# Patient Record
Sex: Male | Born: 1955 | Race: Black or African American | Hispanic: No | Marital: Single | Smoking: Former smoker
Health system: Southern US, Community
[De-identification: ages and names within clinical notes are randomized; demographics above are authoritative.]

## PROBLEM LIST (undated history)

## (undated) DIAGNOSIS — I10 Essential (primary) hypertension: Secondary | ICD-10-CM

## (undated) HISTORY — PX: FRACTURE SURGERY: SHX138

---

## 2014-12-09 ENCOUNTER — Emergency Department (HOSPITAL_BASED_OUTPATIENT_CLINIC_OR_DEPARTMENT_OTHER): Payer: Self-pay

## 2014-12-09 ENCOUNTER — Encounter (HOSPITAL_BASED_OUTPATIENT_CLINIC_OR_DEPARTMENT_OTHER): Payer: Self-pay

## 2014-12-09 ENCOUNTER — Emergency Department (HOSPITAL_BASED_OUTPATIENT_CLINIC_OR_DEPARTMENT_OTHER)
Admission: EM | Admit: 2014-12-09 | Discharge: 2014-12-09 | Disposition: A | Discharge status: Home / Self Care | Payer: Self-pay | Attending: Emergency Medicine | Admitting: Emergency Medicine

## 2014-12-09 DIAGNOSIS — M7122 Synovial cyst of popliteal space [Baker], left knee: Secondary | ICD-10-CM | POA: Insufficient documentation

## 2014-12-09 DIAGNOSIS — Z720 Tobacco use: Secondary | ICD-10-CM | POA: Insufficient documentation

## 2014-12-09 DIAGNOSIS — I10 Essential (primary) hypertension: Secondary | ICD-10-CM | POA: Insufficient documentation

## 2014-12-09 DIAGNOSIS — M660 Rupture of popliteal cyst: Secondary | ICD-10-CM

## 2014-12-09 DIAGNOSIS — M7989 Other specified soft tissue disorders: Secondary | ICD-10-CM

## 2014-12-09 HISTORY — DX: Essential (primary) hypertension: I10

## 2014-12-09 LAB — CBC WITH DIFFERENTIAL/PLATELET
Basophils Absolute: 0.1 10*3/uL (ref 0.0–0.1)
Basophils Relative: 1 % (ref 0–1)
EOS PCT: 9 % — AB (ref 0–5)
Eosinophils Absolute: 0.5 10*3/uL (ref 0.0–0.7)
HCT: 33.2 % — ABNORMAL LOW (ref 39.0–52.0)
Hemoglobin: 10.8 g/dL — ABNORMAL LOW (ref 13.0–17.0)
LYMPHS PCT: 20 % (ref 12–46)
Lymphs Abs: 1.2 10*3/uL (ref 0.7–4.0)
MCH: 28.4 pg (ref 26.0–34.0)
MCHC: 32.5 g/dL (ref 30.0–36.0)
MCV: 87.4 fL (ref 78.0–100.0)
Monocytes Absolute: 0.5 10*3/uL (ref 0.1–1.0)
Monocytes Relative: 9 % (ref 3–12)
NEUTROS PCT: 61 % (ref 43–77)
Neutro Abs: 3.7 10*3/uL (ref 1.7–7.7)
Platelets: 270 10*3/uL (ref 150–400)
RBC: 3.8 MIL/uL — ABNORMAL LOW (ref 4.22–5.81)
RDW: 12.2 % (ref 11.5–15.5)
WBC: 6 10*3/uL (ref 4.0–10.5)

## 2014-12-09 LAB — URINALYSIS, ROUTINE W REFLEX MICROSCOPIC
BILIRUBIN URINE: NEGATIVE
Glucose, UA: NEGATIVE mg/dL
Hgb urine dipstick: NEGATIVE
Ketones, ur: NEGATIVE mg/dL
Leukocytes, UA: NEGATIVE
NITRITE: NEGATIVE
Protein, ur: NEGATIVE mg/dL
SPECIFIC GRAVITY, URINE: 1.014 (ref 1.005–1.030)
Urobilinogen, UA: 0.2 mg/dL (ref 0.0–1.0)
pH: 5.5 (ref 5.0–8.0)

## 2014-12-09 LAB — BASIC METABOLIC PANEL
ANION GAP: 3 — AB (ref 5–15)
BUN: 12 mg/dL (ref 6–23)
CHLORIDE: 107 mmol/L (ref 96–112)
CO2: 27 mmol/L (ref 19–32)
Calcium: 8.9 mg/dL (ref 8.4–10.5)
Creatinine, Ser: 1.15 mg/dL (ref 0.50–1.35)
GFR calc non Af Amer: 68 mL/min — ABNORMAL LOW (ref >90)
GFR, EST AFRICAN AMERICAN: 79 mL/min — AB (ref >90)
Glucose, Bld: 101 mg/dL — ABNORMAL HIGH (ref 70–99)
Potassium: 4 mmol/L (ref 3.5–5.1)
Sodium: 137 mmol/L (ref 135–145)

## 2014-12-09 MED ORDER — IOHEXOL 300 MG/ML  SOLN
100.0000 mL | Freq: Once | INTRAMUSCULAR | Status: AC | PRN
  Administered 2014-12-09: 100 mL via INTRAVENOUS

## 2014-12-09 MED ORDER — OXYCODONE-ACETAMINOPHEN 5-325 MG PO TABS: 1.0000 | ORAL_TABLET | ORAL | Status: DC | PRN

## 2014-12-09 MED ORDER — AMLODIPINE BESYLATE 5 MG PO TABS
10.0000 mg | ORAL_TABLET | Freq: Once | ORAL | Status: AC
  Administered 2014-12-09: 10 mg via ORAL
  Filled 2014-12-09: qty 2

## 2014-12-09 NOTE — ED Notes (Signed)
MD Littie DeedsGentry at bedside discussing results

## 2014-12-09 NOTE — Discharge Instructions (Signed)
Baker Cyst °A Baker cyst is a sac-like structure that forms in the back of the knee. It is filled with the same fluid that is located in your knee. This fluid lubricates the bones and cartilage of the knee and allows them to move over each other more easily. °CAUSES  °When the knee becomes injured or inflamed, increased fluid forms in the knee. When this happens, the joint lining is pushed out behind the knee and forms the Baker cyst. This cyst may also be caused by inflammation from arthritic conditions and infections. °SIGNS AND SYMPTOMS  °A Baker cyst usually has no symptoms. When the cyst is substantially enlarged: °· You may feel pressure behind the knee, stiffness in the knee, or a mass in the area behind the knee. °· You may develop pain, redness, and swelling in the calf.  This can suggest a blood clot and requires evaluation by your health care provider. °DIAGNOSIS  °A Baker cyst is most often found during an ultrasound exam. This exam may have been performed for other reasons, and the cyst was found incidentally. Sometimes an MRI is used. This picks up other problems within a joint that an ultrasound exam may not. If the Baker cyst developed immediately after an injury, X-ray exams may be used to diagnose the cyst. °TREATMENT  °The treatment depends on the cause of the cyst. Anti-inflammatory medicines and rest often will be prescribed. If the cyst is caused by a bacterial infection, antibiotic medicines may be prescribed.  °HOME CARE INSTRUCTIONS  °· If the cyst was caused by an injury, for the first 24 hours, keep the injured leg elevated on 2 pillows while lying down. °· For the first 24 hours while you are awake, apply ice to the injured area: °¨ Put ice in a plastic bag. °¨ Place a towel between your skin and the bag. °¨ Leave the ice on for 20 minutes, 2-3 times a day. °· Only take over-the-counter or prescription medicines for pain, discomfort, or fever as directed by your health care  provider. °· Only take antibiotic medicine as directed. Make sure to finish it even if you start to feel better. °MAKE SURE YOU:  °· Understand these instructions. °· Will watch your condition. °· Will get help right away if you are not doing well or get worse. °Document Released: 10/28/2005 Document Revised: 08/18/2013 Document Reviewed: 06/09/2013 °[employer]® Patient Information ©2015 [employer]. This information is not intended to replace advice given to you by your health care provider. Make sure you discuss any questions you have with your health care provider. ° °

## 2014-12-09 NOTE — ED Notes (Signed)
Pt reports left leg swelling and pain. [facility] Given abx and pain medication. Pt had doppler of leg, which was negative.

## 2014-12-09 NOTE — ED Provider Notes (Signed)
CSN: [id number]     Arrival date & time 12/09/14  4098 History   First MD Initiated Contact with Patient 12/09/14 0900     Chief Complaint  Patient presents with  . Leg Swelling     (Consider location/radiation/quality/duration/timing/severity/associated sxs/prior Treatment) Patient is a 59 y.o. male presenting with leg pain.  Leg Pain Lower extremity pain location: L leg distal to knee. Time since incident:  1 month Injury: no   Pain details:    Quality:  Aching   Radiates to:  Does not radiate   Severity:  Moderate   Onset quality:  Gradual   Duration:  1 month   Timing:  Constant   Progression:  Worsening Chronicity:  New Prior injury to area:  No Relieved by:  Elevation Worsened by:  Bearing weight Associated symptoms: stiffness and swelling   Associated symptoms: no back pain, no decreased ROM, no fever, no itching, no muscle weakness, no neck pain and no numbness     Past Medical History  Diagnosis Date  . Hypertension    Past Surgical History  Procedure Laterality Date  . Fracture surgery     No family history on file. History  Substance Use Topics  . Smoking status: Current Some Day Smoker  . Smokeless tobacco: Not on file  . Alcohol Use: Yes    Review of Systems  Constitutional: Negative for fever.  Musculoskeletal: Positive for stiffness. Negative for back pain and neck pain.  Skin: Negative for itching.  All other systems reviewed and are negative.     Allergies  Review of patient's allergies indicates no known allergies.  Home Medications   Prior to Admission medications   Medication Sig Start Date End Date Taking? Authorizing Provider  oxyCODONE-acetaminophen (PERCOCET/ROXICET) 5-325 MG per tablet Take 1-2 tablets by mouth every 4 (four) hours as needed for severe pain. 12/09/14   Mirian Mo, MD   BP 192/112 mmHg  Pulse 90  Temp(Src) 98 F (36.7 C) (Oral)  Resp 18  Ht  (1.702 m)  Wt 180 lb (81.647 kg)  BMI 28.19 kg/m2  SpO2  98% Physical Exam  Constitutional: He is oriented to person, place, and time. He appears well-developed and well-nourished.  HENT:  Head: Normocephalic and atraumatic.  Eyes: Conjunctivae and EOM are normal.  Neck: Normal range of motion. Neck supple.  Cardiovascular: Normal rate, regular rhythm and normal heart sounds.   Pulmonary/Chest: Effort normal and breath sounds normal. No respiratory distress.  Abdominal: He exhibits no distension. There is no tenderness. There is no rebound and no guarding.  Musculoskeletal: Normal range of motion.       Left lower leg: He exhibits tenderness, swelling and edema. He exhibits no bony tenderness, no deformity and no laceration.  Neurological: He is alert and oriented to person, place, and time.  Skin: Skin is warm and dry.  Vitals reviewed.   ED Course  Procedures (including critical care time) Labs Review Labs Reviewed  CBC WITH DIFFERENTIAL/PLATELET - Abnormal; Notable for the following:    RBC 3.80 (*)    Hemoglobin 10.8 (*)    HCT 33.2 (*)    Eosinophils Relative 9 (*)    All other components within normal limits  BASIC METABOLIC PANEL - Abnormal; Notable for the following:    Glucose, Bld 101 (*)    GFR calc non Af Amer 68 (*)    GFR calc Af Amer 79 (*)    Anion gap 3 (*)    All other  components within normal limits  URINALYSIS, ROUTINE W REFLEX MICROSCOPIC    Imaging Review No results found.   EKG Interpretation None     CT Tibia Fibula Left W Contrast (Final result) Result time: 12/09/14 12:51:10   Final result by Rad Results In Interface (12/09/14 12:51:10)   Narrative:   CLINICAL DATA: Left lower leg swelling and pain since 12/04/2014.  EXAM: CT OF THE LEFT TIBIA AND FIBULA WITH CONTRAST  TECHNIQUE: Contiguous axial CT images of the left lower leg were obtained after IV contrast administration. Coronal and sagittal reformatted images are provided.  CONTRAST: 100 mL OMNIPAQUE IOHEXOL 300 MG/ML  SOLN  COMPARISON: Left lower extremity Doppler ultrasound earlier this same day.  FINDINGS: Extensive subcutaneous edema is present about the left lower leg. The patient has a large Baker's cyst dissecting along the medial gastrocnemius deep to the superficial fascia. The cyst measures approximately 17.5 cm craniocaudal by up to 7.8 cm transverse by 4.3 cm AP. The largest component of the cyst is centered approximately 9.5 cm below the joint line. High attenuation material within the cyst is most consistent with hemorrhage given lack of flow on Doppler imaging on the comparison ultrasound. The cyst causes mass effect on the gastrocnemius.  There is a small knee joint effusion. All imaged osseous structures appear normal. No evidence of muscle or tendon tear is identified. Major caliber vascular structures enhance normally. As visualized by CT scan, intrinsic structures of the knee appear normal.  IMPRESSION: Findings most consistent with a large hemorrhagic Baker's cyst dissecting along the medial gastrocnemius resulting in mass effect on the muscle belly. Repeat ultrasound in 6-8 weeks to ensure resolution is recommended.  Diffuse subcutaneous edema about the left lower leg.  Small knee joint effusion present small left knee joint effusion.   Electronically Signed By: Drusilla Kannerhomas Dalessio M.D. On: 12/09/2014 12:51          US Venous Img Lower Unilateral Left (Final result) Result time: 12/09/14 10:28:41   Procedure changed from US Extrem Low Left Comp      Final result by Rad Results In Interface (12/09/14 10:28:41)   Narrative:   CLINICAL DATA: Left lower leg swelling for 1 month, additional encounter  EXAM: Left LOWER EXTREMITY VENOUS DOPPLER ULTRASOUND  TECHNIQUE: Gray-scale sonography with graded compression, as well as color Doppler and duplex ultrasound were performed to evaluate the lower extremity deep venous systems from the level of the common  femoral vein and including the common femoral, femoral, profunda femoral, popliteal and calf veins including the posterior tibial, peroneal and gastrocnemius veins when visible. The superficial great saphenous vein was also interrogated. Spectral Doppler was utilized to evaluate flow at rest and with distal augmentation maneuvers in the common femoral, femoral and popliteal veins.  COMPARISON: By report from a [facility] exam [facility] dated 12/04/2014. The images are not available for comparison.  FINDINGS: Contralateral Common Femoral Vein: Respiratory phasicity is normal and symmetric with the symptomatic side. No evidence of thrombus. Normal compressibility.  Common Femoral Vein: No evidence of thrombus. Normal compressibility, respiratory phasicity and response to augmentation.  Saphenofemoral Junction: No evidence of thrombus. Normal compressibility and flow on color Doppler imaging.  Profunda Femoral Vein: No evidence of thrombus. Normal compressibility and flow on color Doppler imaging.  Femoral Vein: No evidence of thrombus. Normal compressibility, respiratory phasicity and response to augmentation.  Popliteal Vein: No evidence of thrombus. Normal compressibility, respiratory phasicity and response to augmentation.  Calf Veins: No evidence of  thrombus. Normal compressibility and flow on color Doppler imaging.  Superficial Great Saphenous Vein: No evidence of thrombus. Normal compressibility and flow on color Doppler imaging.  Venous Reflux: None.  Other Findings: Extending from the popliteal fossa into the medial aspect of the proximal calf there is a somewhat complex collection identified which measures 18 cm 18 x 8 x 3 cm in greatest dimensions. Correlation with any proximal previous injury is recommended as this may represent a large hematoma. Alternatively a could represent a ruptured complex Baker cyst. Calf edema is  noted.  IMPRESSION: No evidence of deep venous thrombosis.  Changes in the soft tissues of the medial left calf which may represent a focal hematoma or complicated Baker's cyst. These changes were not described on the prior exam. MRI may be helpful in further characterization on a nonemergent basis.   Electronically Signed By: Alcide Clever M.D. On: 12/09/2014 10:16       MDM   Final diagnoses:  Baker's cyst, ruptured    59 y.o. male with pertinent PMH of HTN presents with l leg swelling x 1 month.  Patient was seen at [facility], diagnosed with cellulitis and sent home with clindamycin.  He reportedly had a negative ultrasound.  On arrival today vitals and physical exam as above. No recent risk factors for DVT, however patient has 3+ pitting edema to the left lower extremity, not present on the right. Leg not warm, nonerythematous. Patient denies systemic symptoms.    Dorna Bloom revealed hemorrhagic bakers cyst.  Pulses intact distally, sensation intact, mild pain.  Doubt compartment syndrome.  Spoke with ortho who recommended outpt fu.  DC home in stable condition  I have reviewed all laboratory and imaging studies if ordered as above  1. Baker's cyst, ruptured   2. Leg swelling         Mirian Mo, MD 12/13/14 0430

## 2014-12-09 NOTE — ED Notes (Signed)
Patient transported to Ultrasound 

## 2014-12-09 NOTE — ED Notes (Signed)
MD at bedside. 

## 2014-12-09 NOTE — ED Notes (Signed)
Pt is supposed to be on HCTZ and Norvasc. Pt sts he is out of medication.

## 2014-12-16 ENCOUNTER — Ambulatory Visit (INDEPENDENT_AMBULATORY_CARE_PROVIDER_SITE_OTHER): Payer: No Typology Code available for payment source | Admitting: Family Medicine

## 2014-12-16 ENCOUNTER — Encounter (HOSPITAL_BASED_OUTPATIENT_CLINIC_OR_DEPARTMENT_OTHER): Payer: Self-pay | Admitting: Emergency Medicine

## 2014-12-16 ENCOUNTER — Encounter: Payer: Self-pay | Admitting: Family Medicine

## 2014-12-16 ENCOUNTER — Emergency Department (HOSPITAL_BASED_OUTPATIENT_CLINIC_OR_DEPARTMENT_OTHER)
Admission: EM | Admit: 2014-12-16 | Discharge: 2014-12-16 | Disposition: A | Discharge status: Home / Self Care | Payer: Self-pay | Attending: Emergency Medicine | Admitting: Emergency Medicine

## 2014-12-16 VITALS — BP 199/127 | HR 74 | Ht 67.0 in | Wt 180.0 lb

## 2014-12-16 DIAGNOSIS — Z792 Long term (current) use of antibiotics: Secondary | ICD-10-CM | POA: Insufficient documentation

## 2014-12-16 DIAGNOSIS — I10 Essential (primary) hypertension: Secondary | ICD-10-CM | POA: Insufficient documentation

## 2014-12-16 DIAGNOSIS — M25562 Pain in left knee: Secondary | ICD-10-CM

## 2014-12-16 DIAGNOSIS — Z87891 Personal history of nicotine dependence: Secondary | ICD-10-CM | POA: Insufficient documentation

## 2014-12-16 DIAGNOSIS — M660 Rupture of popliteal cyst: Secondary | ICD-10-CM

## 2014-12-16 DIAGNOSIS — Z79899 Other long term (current) drug therapy: Secondary | ICD-10-CM | POA: Insufficient documentation

## 2014-12-16 DIAGNOSIS — M7122 Synovial cyst of popliteal space [Baker], left knee: Secondary | ICD-10-CM | POA: Insufficient documentation

## 2014-12-16 MED ORDER — HYDROCODONE-ACETAMINOPHEN 5-325 MG PO TABS
2.0000 | ORAL_TABLET | Freq: Once | ORAL | Status: AC
  Administered 2014-12-16: 2 via ORAL
  Filled 2014-12-16: qty 2

## 2014-12-16 MED ORDER — HYDROCODONE-ACETAMINOPHEN 5-325 MG PO TABS: 1.0000 | ORAL_TABLET | Freq: Four times a day (QID) | ORAL | Status: DC | PRN

## 2014-12-16 MED ORDER — METHYLPREDNISOLONE ACETATE 40 MG/ML IJ SUSP
40.0000 mg | Freq: Once | INTRAMUSCULAR | Status: AC
  Administered 2014-12-16: 40 mg via INTRA_ARTICULAR

## 2014-12-16 NOTE — Progress Notes (Signed)
  CARE MANAGEMENT ED NOTE 12/16/2014  Patient:  [name] Chaney Chaney,[name] Chaney   Account Number:  [id number]  Date Initiated:  12/16/2014  Documentation initiated by:  Edd ArbourGIBBS,Jermani Eberlein  Subjective/Objective Assessment:   59 yr old self [place] ([place] listed as address in EPIC- confirmed by pt)  left leg pain behind knee cap.  Reports swelling and that he was seen here last week for the same and was diagnosed with Baker's cyst.  Pt     Subjective/Objective Assessment Detail:   reports he has been taking clindamycin as prescribed, and ibprofen and oxycodone for pain.     Pcp Verdell FaceSheri Lim as confimred by pt at 1227 12/16/14    Carola FrostLim Sheri Davis DO Internist  Address: [address], [place], [place] [address]  [phone]    Pt confirmed [facility] prior to leaving     Action/Plan:   CM received consult from ED SW, Kristen who was called Pt needed pcp assistance Spoek with pt who confirms he has a pcp 1235 ED CM updated Dr DM [facility] Cm signing off   Action/Plan Detail:   pt was called mobile # [phone]   Anticipated DC Date:  12/16/2014     Status Recommendation to Physician:   Result of Recommendation:    Other ED Services  Consult Working Plan    DC Planning Services  Other  Outpatient Services - Pt will follow up  PCP issues    Choice offered to / List presented to:            Status of service:  Completed, signed off  ED Comments:   ED Comments Detail:

## 2014-12-16 NOTE — Progress Notes (Signed)
CSW received consult for follow up and PCP. CSW informed ED RN CM who will follow up.   Byrd HesselbachKristen Hilton Saephan, LCSW [phone]  ED CSW 12/16/2014 11:30 AM

## 2014-12-16 NOTE — ED Provider Notes (Signed)
CSN: [id number]     Arrival date & time 12/16/14  0817 History   First MD Initiated Contact with Patient 12/16/14 [phone]     Chief Complaint  Patient presents with  . Leg Pain    Pt reports left leg pain behind knee cap.  Reports swelling and that he was seen here last week for the same and was diagnosed with Baker's cyst.  Pt reports he has been taking clindamycin as prescribed, and ibprofen and oxycodone for pain.     (Consider location/radiation/quality/duration/timing/severity/associated sxs/prior Treatment) Patient is a 59 y.o. male presenting with leg pain. The history is provided by the patient. No language interpreter was used.  Leg Pain Location:  Leg Time since incident: 2.5 months] Injury: no   Leg location:  L lower leg Pain details:    Quality:  Aching and cramping   Radiates to:  L leg   Severity:  Moderate   Onset quality:  Sudden   Duration: 2.5 months.   Timing:  Constant   Progression:  Waxing and waning Dislocation: no   Foreign body present:  No foreign bodies Tetanus status:  Up to date Relieved by:  Rest Worsened by:  Bearing weight and activity Ineffective treatments:  NSAIDs and rest Associated symptoms: swelling   Associated symptoms: no back pain, no decreased ROM, no fatigue, no fever, no itching, no neck pain, no numbness, no stiffness and no tingling   Risk factors: no concern for non-accidental trauma, no frequent fractures, no known bone disorder, no obesity and no recent illness     Past Medical History  Diagnosis Date  . Hypertension    Past Surgical History  Procedure Laterality Date  . Fracture surgery     History reviewed. No pertinent family history. History  Substance Use Topics  . Smoking status: Former Games developer  . Smokeless tobacco: Not on file  . Alcohol Use: Yes    Review of Systems  Constitutional: Negative for fever, activity change, appetite change and fatigue.  HENT: Negative for congestion, facial swelling, rhinorrhea and  trouble swallowing.   Eyes: Negative for photophobia and pain.  Respiratory: Negative for cough, chest tightness and shortness of breath.   Cardiovascular: Negative for chest pain and leg swelling.  Gastrointestinal: Negative for nausea, vomiting, abdominal pain, diarrhea and constipation.  Endocrine: Negative for polydipsia and polyuria.  Genitourinary: Negative for dysuria, urgency, decreased urine volume and difficulty urinating.  Musculoskeletal: Negative for back pain, gait problem, stiffness and neck pain.  Skin: Negative for color change, itching, rash and wound.  Allergic/Immunologic: Negative for immunocompromised state.  Neurological: Negative for dizziness, facial asymmetry, speech difficulty, weakness, numbness and headaches.  Psychiatric/Behavioral: Negative for confusion, decreased concentration and agitation.      Allergies  Review of patient's allergies indicates no known allergies.  Home Medications   Prior to Admission medications   Medication Sig Start Date End Date Taking? Authorizing Provider  amLODipine (NORVASC) 10 MG tablet Take 10 mg by mouth daily.   Yes Historical Provider, MD  clindamycin (CLEOCIN) 150 MG capsule Take 300 mg by mouth 4 (four) times daily.   Yes Historical Provider, MD  cyclobenzaprine (FLEXERIL) 5 MG tablet Take 5 mg by mouth at bedtime.   Yes Historical Provider, MD  HYDROcodone-acetaminophen (NORCO/VICODIN) 5-325 MG per tablet Take 1 tablet by mouth every 6 (six) hours as needed for moderate pain.   Yes Historical Provider, MD  ibuprofen (ADVIL,MOTRIN) 800 MG tablet Take 800 mg by mouth every 8 (eight) hours as  needed.   Yes Historical Provider, MD  lisinopril (PRINIVIL,ZESTRIL) 20 MG tablet Take 20 mg by mouth daily.   Yes Historical Provider, MD  oxyCODONE-acetaminophen (PERCOCET/ROXICET) 5-325 MG per tablet Take 1-2 tablets by mouth every 4 (four) hours as needed for severe pain. 12/09/14   Mirian MoMatthew Gentry, MD   BP 186/104 mmHg  Pulse 92   Temp(Src) 97.7 F (36.5 C) (Oral)  Resp 18  Ht 5\' 7"  (1.702 m)  Wt 180 lb (81.647 kg)  BMI 28.19 kg/m2  SpO2 100% Physical Exam  Constitutional: He is oriented to person, place, and time. He appears well-developed and well-nourished. No distress.  HENT:  Head: Normocephalic and atraumatic.  Mouth/Throat: No oropharyngeal exudate.  Eyes: Pupils are equal, round, and reactive to light.  Neck: Normal range of motion. Neck supple.  Cardiovascular: Normal rate, regular rhythm and normal heart sounds.  Exam reveals no gallop and no friction rub.   No murmur heard. Pulmonary/Chest: Effort normal and breath sounds normal. No respiratory distress. He has no wheezes. He has no rales.  Abdominal: Soft. Bowel sounds are normal. He exhibits no distension and no mass. There is no tenderness. There is no rebound and no guarding.  Musculoskeletal: Normal range of motion. He exhibits no edema or tenderness.   Pitting edema of left lower leg from knee to ankle with evidence of old appearing ecchymosis around the ankle and foot.  Gastrocnemius is firm and mildly tender.  He has worse tenderness at popliteal fossa.  He has no decreased range of motion of the knee or ankle.  He has normal sensation and strength distally.  Pulses are 2+ distally.  Neurological: He is alert and oriented to person, place, and time.  Skin: Skin is warm and dry.  Psychiatric: He has a normal mood and affect.    ED Course  Procedures (including critical care time) Labs Review Labs Reviewed - No data to display  Imaging Review No results found.   EKG Interpretation None     CLINICAL DATA: Left lower leg swelling and pain since 12/04/2014.  EXAM: CT OF THE LEFT TIBIA AND FIBULA WITH CONTRAST  TECHNIQUE: Contiguous axial CT images of the left lower leg were obtained after IV contrast administration. Coronal and sagittal reformatted images are provided.  CONTRAST: 100 mL OMNIPAQUE IOHEXOL 300 MG/ML  SOLN  COMPARISON: Left lower extremity Doppler ultrasound earlier this same day.  FINDINGS: Extensive subcutaneous edema is present about the left lower leg. The patient has a large Baker's cyst dissecting along the medial gastrocnemius deep to the superficial fascia. The cyst measures approximately 17.5 cm craniocaudal by up to 7.8 cm transverse by 4.3 cm AP. The largest component of the cyst is centered approximately 9.5 cm below the joint line. High attenuation material within the cyst is most consistent with hemorrhage given lack of flow on Doppler imaging on the comparison ultrasound. The cyst causes mass effect on the gastrocnemius.  There is a small knee joint effusion. All imaged osseous structures appear normal. No evidence of muscle or tendon tear is identified. Major caliber vascular structures enhance normally. As visualized by CT scan, intrinsic structures of the knee appear normal.  IMPRESSION: Findings most consistent with a large hemorrhagic Baker's cyst dissecting along the medial gastrocnemius resulting in mass effect on the muscle belly. Repeat ultrasound in 6-8 weeks to ensure resolution is recommended.  Diffuse subcutaneous edema about the left lower leg.  Small knee joint effusion present small left knee joint effusion.   Electronically  Signed  By: Drusilla Kanner M.D.  On: 12/09/2014 12:51  MDM   Final diagnoses:  Ruptured Bakers cyst    Pt is a 59 y.o. male with Pmhx as above who presents with 2 months of left leg pain and swelling from posterior knee down to ankle.  He said no fevers or chills.  No numbness or tingling or weakness but does have pain with ambulation.  Pain is worse in popliteal fossa.  Calf is firm, and he has pitting edema to the level of knee, with evidence of healing ecchymosis around foot.  Leg is most tender in popliteal fossa.  He was seen one week ago for same, had a DVT study, which showed no evidence of DVT, but  possibly complicated Baker's cyst rupture.  He then had a CT leg with contrast, which showed, evidence of a large hemorrhagic Baker cyst dissecting along the medial gastrocnemius resolving and mass effect on the muscle belly.  Repeat ultrasound in 6-8 weeks was recommended.  Patient was given outpatient follow-up information for orthopedics.  He reports calling but that he cannot get an appointment as he does not have insurance.  He states his other but otherwise been well except for his blood pressure being high.  He has not had chest pain or shortness of breath.  This Is firm, although he has no other signs or symptoms of compartment syndrome, which I doubt.  I spoke w/ Dr. Pearletha Forge with sports medicine on the phone who has graciously agreed to see him upstairs in the office now.       Toy Cookey, MD 12/16/14 1106

## 2014-12-16 NOTE — Discharge Instructions (Signed)
Baker Cyst °A Baker cyst is a sac-like structure that forms in the back of the knee. It is filled with the same fluid that is located in your knee. This fluid lubricates the bones and cartilage of the knee and allows them to move over each other more easily. °CAUSES  °When the knee becomes injured or inflamed, increased fluid forms in the knee. When this happens, the joint lining is pushed out behind the knee and forms the Baker cyst. This cyst may also be caused by inflammation from arthritic conditions and infections. °SIGNS AND SYMPTOMS  °A Baker cyst usually has no symptoms. When the cyst is substantially enlarged: °· You may feel pressure behind the knee, stiffness in the knee, or a mass in the area behind the knee. °· You may develop pain, redness, and swelling in the calf.  This can suggest a blood clot and requires evaluation by your health care provider. °DIAGNOSIS  °A Baker cyst is most often found during an ultrasound exam. This exam may have been performed for other reasons, and the cyst was found incidentally. Sometimes an MRI is used. This picks up other problems within a joint that an ultrasound exam may not. If the Baker cyst developed immediately after an injury, X-ray exams may be used to diagnose the cyst. °TREATMENT  °The treatment depends on the cause of the cyst. Anti-inflammatory medicines and rest often will be prescribed. If the cyst is caused by a bacterial infection, antibiotic medicines may be prescribed.  °HOME CARE INSTRUCTIONS  °· If the cyst was caused by an injury, for the first 24 hours, keep the injured leg elevated on 2 pillows while lying down. °· For the first 24 hours while you are awake, apply ice to the injured area: °¨ Put ice in a plastic bag. °¨ Place a towel between your skin and the bag. °¨ Leave the ice on for 20 minutes, 2-3 times a day. °· Only take over-the-counter or prescription medicines for pain, discomfort, or fever as directed by your health care  provider. °· Only take antibiotic medicine as directed. Make sure to finish it even if you start to feel better. °MAKE SURE YOU:  °· Understand these instructions. °· Will watch your condition. °· Will get help right away if you are not doing well or get worse. °Document Released: 10/28/2005 Document Revised: 08/18/2013 Document Reviewed: 06/09/2013 °[employer]® Patient Information ©2015 [employer]. This information is not intended to replace advice given to you by your health care provider. Make sure you discuss any questions you have with your health care provider. ° °

## 2014-12-16 NOTE — Patient Instructions (Signed)
You have a large hemorrhagic bakers cyst. We injected your knee with cortisone today - this will help shrink this down. Wear compression sleeve or ACE wrap regularly even at bedtime if you can tolerate this. Icing 15 minutes at a time 3-4 times a day. Ibuprofen 600mg  three times a day with food OR aleve 2 tabs twice a day with food for pain and inflammation. Do some simple ankle motion exercises every day to make sure you don't get a blood clot. If you're not improving over 1-2 weeks call me and we can try to inject some of the cyst directly.

## 2014-12-20 DIAGNOSIS — M25562 Pain in left knee: Secondary | ICD-10-CM | POA: Insufficient documentation

## 2014-12-20 NOTE — Progress Notes (Signed)
PCP: LIM,SHERI, DO  Subjective:   HPI: Patient is a 59 y.o. male here for left leg pain.  Patient reports for past 2 months he's had increased swelling, pain posterior left knee and calf. Had doppler u/s negative for DVT. 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 Pain 9/10 level. Taking norco. No real anterior knee pain. Can feel unstable at times. No catching or locking.  Past Medical History  Diagnosis Date  . Hypertension     Current Outpatient Prescriptions on File Prior to Visit  Medication Sig Dispense Refill  . amLODipine (NORVASC) 10 MG tablet Take 10 mg by mouth daily.    . clindamycin (CLEOCIN) 150 MG capsule Take 300 mg by mouth 4 (four) times daily.    . cyclobenzaprine (FLEXERIL) 5 MG tablet Take 5 mg by mouth at bedtime.    Marland Kitchen. ibuprofen (ADVIL,MOTRIN) 800 MG tablet Take 800 mg by mouth every 8 (eight) hours as needed.    Marland Kitchen. lisinopril (PRINIVIL,ZESTRIL) 20 MG tablet Take 20 mg by mouth daily.    Marland Kitchen. oxyCODONE-acetaminophen (PERCOCET/ROXICET) 5-325 MG per tablet Take 1-2 tablets by mouth every 4 (four) hours as needed for severe pain. 15 tablet 0   No current facility-administered medications on file prior to visit.    Past Surgical History  Procedure Laterality Date  . Fracture surgery      No Known Allergies  History   Social History  . Marital Status: Single    Spouse Name: N/A    Number of Children: N/A  . Years of Education: N/A   Occupational History  . Not on file.   Social History Main Topics  . Smoking status: Former Games developermoker  . Smokeless tobacco: Not on file  . Alcohol Use: 0.0 oz/week    0 Not specified per week  . Drug Use: No  . Sexual Activity: Not on file   Other Topics Concern  . Not on file   Social History Narrative    No family history on file.  BP 199/127 mmHg  Pulse 74  Ht 5\' 7"  (1.702 m)  Wt 180 lb (81.647 kg)  BMI 28.19 kg/m2  Review of Systems: See HPI above.    Objective:  Physical Exam:  Gen: NAD  Left  knee/calf: Swelling proximal to mid calf and posterior knee.  No bruising, erythema, palpable cords. TTP throughout calf and posterior knee.  No anterior tenderness. FROM. Negative ant/post drawers. Negative valgus/varus testing. Negative lachmanns. Negative mcmurrays, apleys, patellar apprehension. NV intact distally.    MSK u/s:  Unfortunately there does not appear to be many hypoechoic areas within his hemorrhagic bakers cyst - could identify three relatively small areas proximal calf.  Assessment & Plan:  1. Left knee pain - 2/2 hemorrhagic bakers cyst.  Unfortunately not many areas I would be able to aspirate to provide him with relief.  Advised going ahead with intraarticular injection - has a small effusion - as medication will travel into the cystic component and less risky.  Compression sleeve, elevation, icing very important.  NSAIDs.  Motion exercises of ankle.  Call in 1-2 weeks if not improving - could consider aspiration/injection of one of these cystic areas.  After informed written consent, patient was lying supine on exam table. Left knee was prepped with alcohol swab and utilizing superolateral approach, patient's left knee was injected intraarticularly with 3:1 marcaine: depomedrol. Patient tolerated the procedure well without immediate complications.

## 2014-12-20 NOTE — Assessment & Plan Note (Signed)
2/2 hemorrhagic bakers cyst.  Unfortunately not many areas I would be able to aspirate to provide him with relief.  Advised going ahead with intraarticular injection - has a small effusion - as medication will travel into the cystic component and less risky.  Compression sleeve, elevation, icing very important.  NSAIDs.  Motion exercises of ankle.  Call in 1-2 weeks if not improving - could consider aspiration/injection of one of these cystic areas.  After informed written consent, patient was lying supine on exam table. Left knee was prepped with alcohol swab and utilizing superolateral approach, patient's left knee was injected intraarticularly with 3:1 marcaine: depomedrol. Patient tolerated the procedure well without immediate complications.

## 2015-06-25 IMAGING — US US EXTREM LOW VENOUS*L*
1 series · 13 of 24 positions shown · non-contrast
Comparison: By report from a prior exam from [REDACTED] dated 12/04/2014. The images are not available for
comparison.

CLINICAL DATA: Left lower leg swelling for 1 month, additional
encounter



[Series 1: us extrem low venous*left* · 0.06mm/px · 13 of 45 slices shown]
[im 1/45]
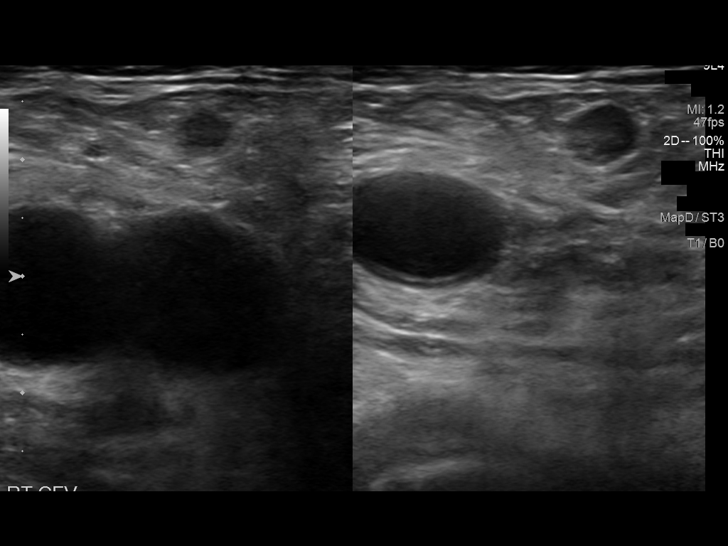
[im 4/45]
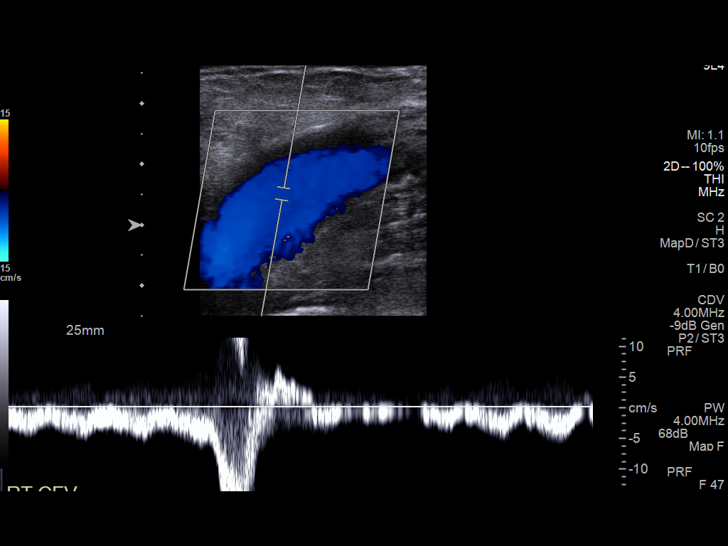
[im 8/45]
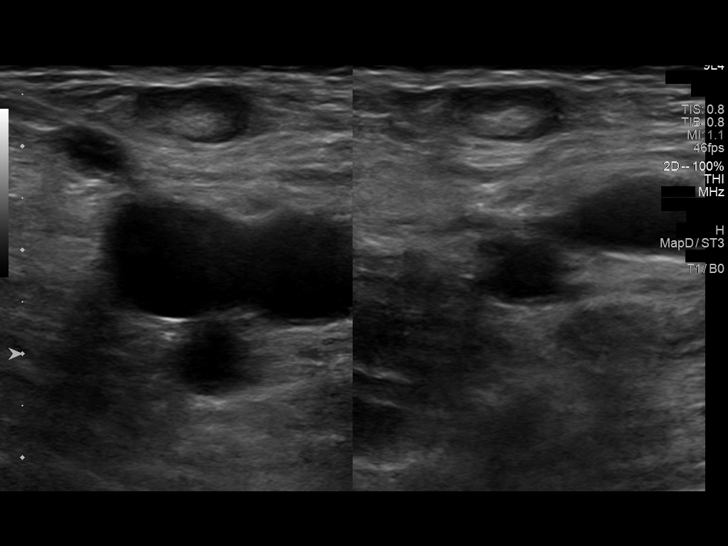
[im 12/45]
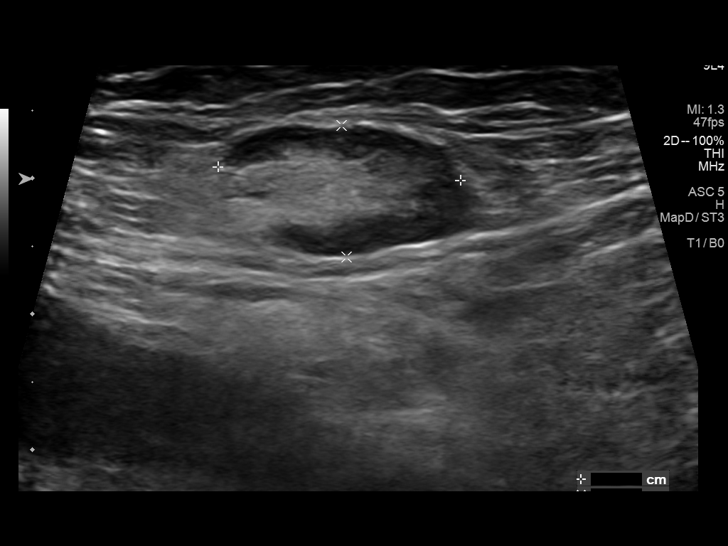
[im 16/45]
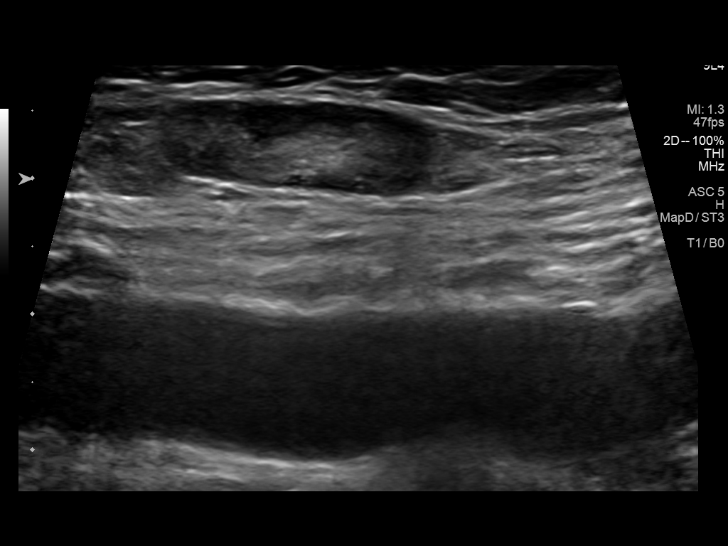
[im 20/45]
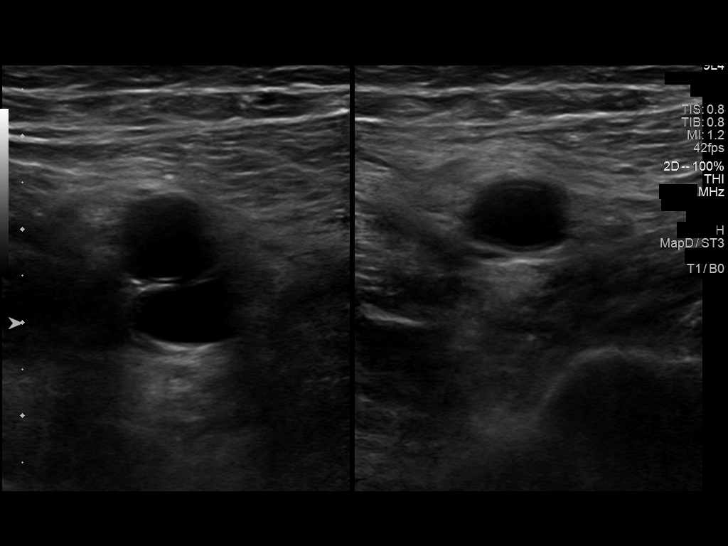
[im 23/45]
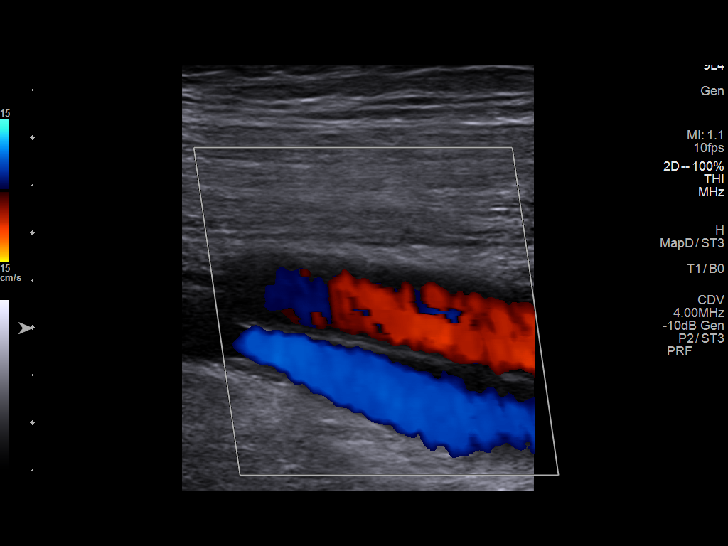
[im 25/45]
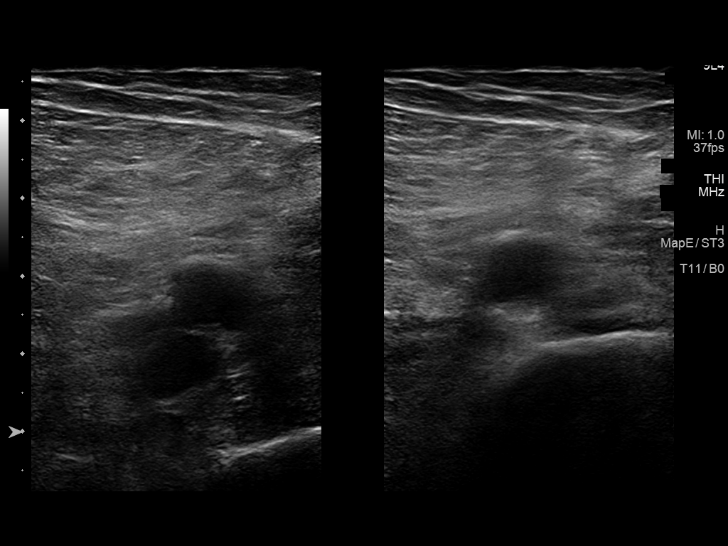
[im 29/45]
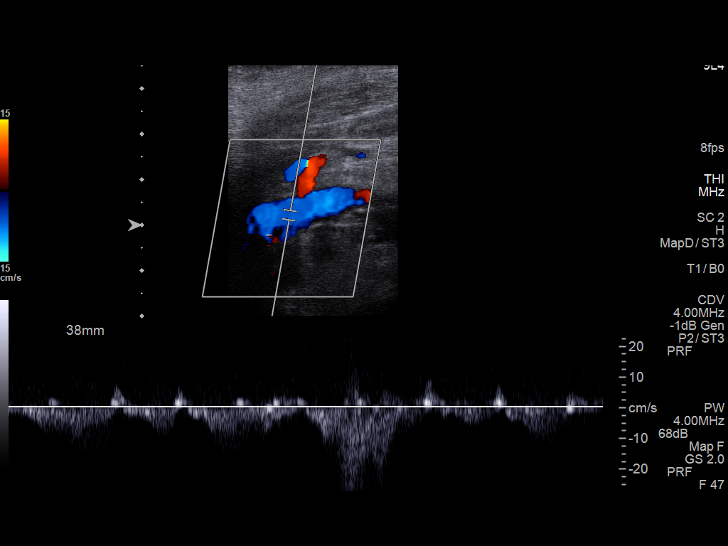
[im 33/45]
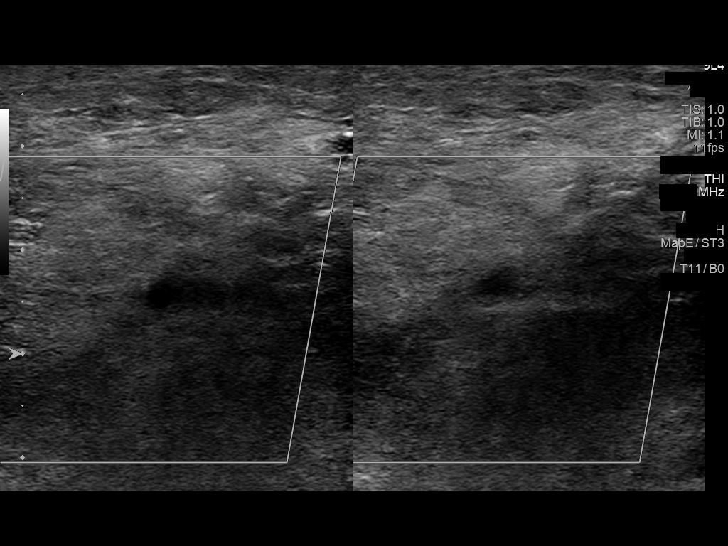
[im 37/45]
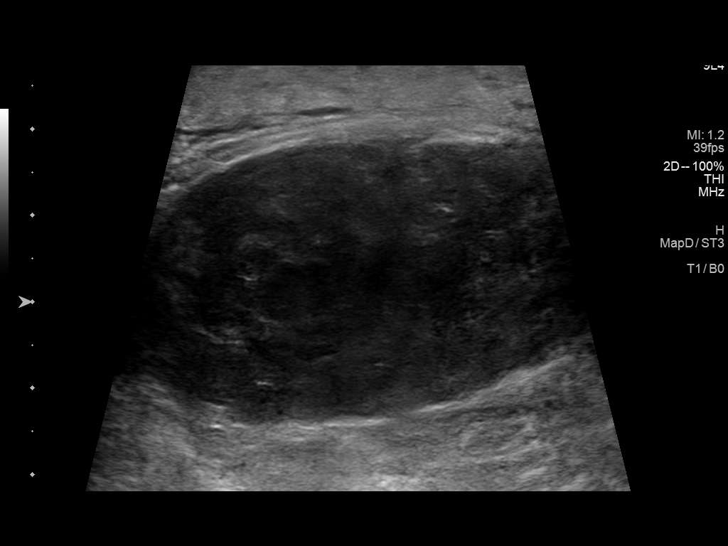
[im 41/45]
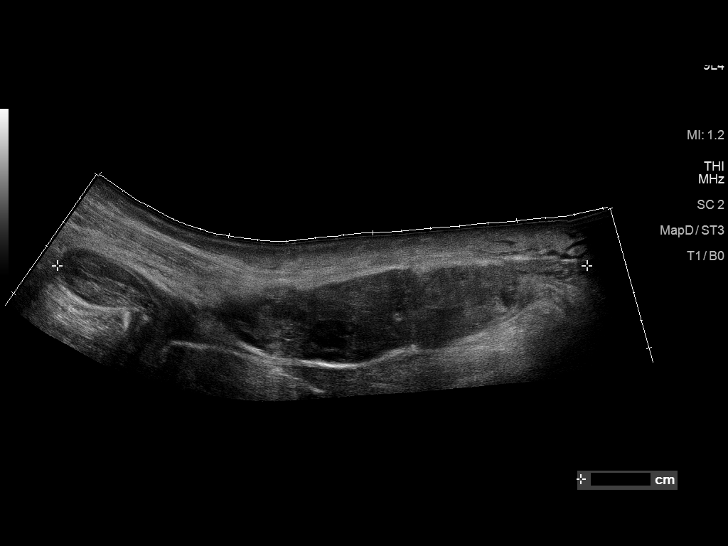
[im 45/45]
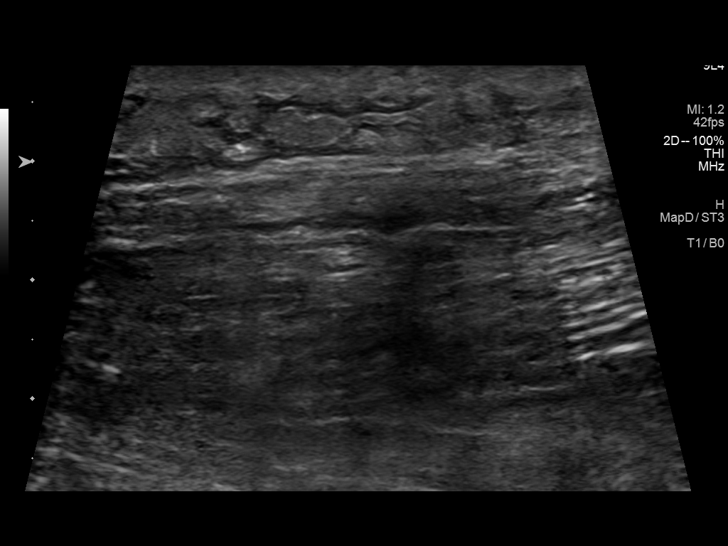

[13 of 24 positions shown; findings below may reference images not displayed]

FINDINGS: Contralateral Common Femoral Vein: Respiratory phasicity is normal
and symmetric with the symptomatic side. No evidence of thrombus.
Normal compressibility.

Common Femoral Vein: No evidence of thrombus. Normal
compressibility, respiratory phasicity and response to augmentation.

Saphenofemoral Junction: No evidence of thrombus. Normal
compressibility and flow on color Doppler imaging.

Profunda Femoral Vein: No evidence of thrombus. Normal
compressibility and flow on color Doppler imaging.

Femoral Vein: No evidence of thrombus. Normal compressibility,
respiratory phasicity and response to augmentation.

Popliteal Vein: No evidence of thrombus. Normal compressibility,
respiratory phasicity and response to augmentation.

Calf Veins: No evidence of thrombus. Normal compressibility and flow
on color Doppler imaging.

Superficial Great Saphenous Vein: No evidence of thrombus. Normal
compressibility and flow on color Doppler imaging.

Venous Reflux:  None.

Other Findings: Extending from the popliteal fossa into the medial
aspect of the proximal calf there is a somewhat complex collection
identified which measures 18 cm 18 x 8 x 3 cm in greatest
dimensions. Correlation with any proximal previous injury is
recommended as this may represent a large hematoma. Alternatively a
could represent a ruptured complex Baker cyst. Calf edema is noted.
IMPRESSION: No evidence of deep venous thrombosis.

Changes in the soft tissues of the medial left calf which may
represent a focal hematoma or complicated Baker's cyst. These
changes were not described on the prior exam. MRI may be helpful in
further characterization on a nonemergent basis.

## 2017-12-20 ENCOUNTER — Other Ambulatory Visit: Payer: Self-pay

## 2017-12-20 ENCOUNTER — Encounter (HOSPITAL_BASED_OUTPATIENT_CLINIC_OR_DEPARTMENT_OTHER): Payer: Self-pay | Admitting: *Deleted

## 2017-12-20 ENCOUNTER — Emergency Department (HOSPITAL_BASED_OUTPATIENT_CLINIC_OR_DEPARTMENT_OTHER)
Admission: EM | Admit: 2017-12-20 | Discharge: 2017-12-20 | Disposition: A | Discharge status: Home / Self Care | Payer: Medicaid Other | Attending: Physician Assistant | Admitting: Physician Assistant

## 2017-12-20 DIAGNOSIS — R21 Rash and other nonspecific skin eruption: Secondary | ICD-10-CM

## 2017-12-20 DIAGNOSIS — I10 Essential (primary) hypertension: Secondary | ICD-10-CM | POA: Diagnosis not present

## 2017-12-20 DIAGNOSIS — Z79899 Other long term (current) drug therapy: Secondary | ICD-10-CM | POA: Insufficient documentation

## 2017-12-20 DIAGNOSIS — Z87891 Personal history of nicotine dependence: Secondary | ICD-10-CM | POA: Diagnosis not present

## 2017-12-20 MED ORDER — TRIAMCINOLONE ACETONIDE 0.1 % EX CREA: 1.0000 "application " | TOPICAL_CREAM | Freq: Two times a day (BID) | CUTANEOUS | 0 refills | Status: AC

## 2017-12-20 NOTE — Discharge Instructions (Signed)
Apply triamcinolone cream twice daily to your rash.  Please follow-up with a dermatologist as soon as possible for further evaluation and treatment of your rash.  Please follow-up with your doctor if you are unable to see the dermatologist over the next few weeks.  Please return to the emergency department if you develop any increasing pain, drainage, redness, red streaking from the area, or fever.

## 2017-12-20 NOTE — ED Provider Notes (Signed)
[facility] EMERGENCY DEPARTMENT Provider Note   CSN: [id number] Arrival date & time: 12/20/17  1436     History   Chief Complaint Chief Complaint  Patient presents with  . Rash    HPI [name] Chaney is a 62 y.o. male with history of hypertension who presents with a over 3071-month history of rash to right leg.  Patient reports it is occasionally itchy.  He has been waxing and waning since onset.  He denies any  new soaps or detergents.  He has tried putting Vaseline on it which does seem to help it, however it always returns.  He has never seen any drainage coming from it.  He denies any other affected areas.  HPI  Past Medical History:  Diagnosis Date  . Hypertension     Patient Active Problem List   Diagnosis Date Noted  . Left knee pain 12/20/2014    Past Surgical History:  Procedure Laterality Date  . FRACTURE SURGERY         Home Medications    Prior to Admission medications   Medication Sig Start Date End Date Taking? Authorizing Provider  amLODipine (NORVASC) 10 MG tablet Take 10 mg by mouth daily.    [provider]  lisinopril (PRINIVIL,ZESTRIL) 20 MG tablet Take 20 mg by mouth daily.    [provider]  triamcinolone cream (KENALOG) 0.1 % Apply 1 application topically 2 (two) times daily. 12/20/17   Emi HolesLaw, Denarius Sesler M, PA-C    Family History History reviewed. No pertinent family history.  Social History Social History   Tobacco Use  . Smoking status: Former Smoker  Substance Use Topics  . Alcohol use: Yes    Alcohol/week: 0.0 oz  . Drug use: No     Allergies   Patient has no known allergies.   Review of Systems Review of Systems  Constitutional: Negative for fever.  Skin: Positive for rash.     Physical Exam Updated Vital Signs BP 118/72 (BP Location: Right Arm)   Pulse 89   Temp 98.4 F (36.9 C) (Oral)   Resp 18   Ht 5\' 7"  (1.702 m)   Wt 79.4 kg (175 lb)   SpO2 98%   BMI 27.41 kg/m   Physical Exam   Constitutional: He appears well-developed and well-nourished. No distress.  HENT:  Head: Normocephalic and atraumatic.  Mouth/Throat: Oropharynx is clear and moist. No oropharyngeal exudate.  Eyes: Conjunctivae are normal. Pupils are equal, round, and reactive to light. Right eye exhibits no discharge. Left eye exhibits no discharge. No scleral icterus.  Neck: Normal range of motion. Neck supple. No thyromegaly present.  Cardiovascular: Normal rate, regular rhythm, normal heart sounds and intact distal pulses. Exam reveals no gallop and no friction rub.  No murmur heard. Pulmonary/Chest: Effort normal and breath sounds normal. No stridor. No respiratory distress. He has no wheezes. He has no rales.  Abdominal: Soft. Bowel sounds are normal. He exhibits no distension. There is no tenderness. There is no rebound and no guarding.  Musculoskeletal: He exhibits no edema.  Lymphadenopathy:    He has no cervical adenopathy.  Neurological: He is alert. Coordination normal.  Skin: Skin is warm and dry. No rash noted. He is not diaphoretic. No pallor.  Flaky, plaque-like rash with underlying hyperpigmentation to right medial, distal lower leg; no drainage; see photo  Psychiatric: He has a normal mood and affect.  Nursing note and vitals reviewed.      ED Treatments / Results  Labs (  all labs ordered are listed, but only abnormal results are displayed) Labs Reviewed - No data to display  EKG  EKG Interpretation None       Radiology No results found.  Procedures Procedures (including critical care time)  Medications Ordered in ED Medications - No data to display   Initial Impression / Assessment and Plan / ED Course  I have reviewed the triage vital signs and the nursing notes.  Pertinent labs & imaging results that were available during my care of the patient were reviewed by me and considered in my medical decision making (see chart for details).     Patient with ongoing  skin rash for the past several months.  No signs of infection or emergent skin infection such as SJS or TEN.  Will treat with Kenalog and follow-up with dermatology.  No signs of secondary infection.  Return precautions discussed.  Patient understands and agrees with plan.  Patient vitals stable throughout ED course and discharged in satisfactory condition. I discussed patient case with Dr. Corlis Leak who guided the patient's management and agrees with plan.   Final Clinical Impressions(s) / ED Diagnoses   Final diagnoses:  Rash and nonspecific skin eruption    ED Discharge Orders        Ordered    triamcinolone cream (KENALOG) 0.1 %  2 times daily     12/20/17 [address] [place] 12/20/17 1619    Abelino Derrick, MD 12/20/17 2344

## 2017-12-20 NOTE — ED Triage Notes (Signed)
Rash on right lower leg x several months.
